# Patient Record
Sex: Female | Born: 1960 | Race: White | Hispanic: No | Marital: Single | Smoking: Current every day smoker
Health system: Southern US, Community
[De-identification: ages and names within clinical notes are randomized; demographics above are authoritative.]

## PROBLEM LIST (undated history)

## (undated) DIAGNOSIS — I639 Cerebral infarction, unspecified: Secondary | ICD-10-CM

## (undated) HISTORY — PX: OTHER SURGICAL HISTORY: SHX169

## (undated) HISTORY — PX: HERNIA REPAIR: SHX51

## (undated) HISTORY — PX: ABDOMINAL HYSTERECTOMY: SHX81

---

## 1997-09-30 ENCOUNTER — Inpatient Hospital Stay (HOSPITAL_COMMUNITY): Admission: AD | Admit: 1997-09-30 | Discharge: 1997-09-30 | Payer: Self-pay | Admitting: Obstetrics and Gynecology

## 1997-11-01 ENCOUNTER — Inpatient Hospital Stay (HOSPITAL_COMMUNITY): Admission: AD | Admit: 1997-11-01 | Discharge: 1997-11-01 | Payer: Self-pay | Admitting: Obstetrics and Gynecology

## 1997-11-30 ENCOUNTER — Inpatient Hospital Stay (HOSPITAL_COMMUNITY): Admission: AD | Admit: 1997-11-30 | Discharge: 1997-11-30 | Payer: Self-pay | Admitting: Obstetrics and Gynecology

## 1997-12-29 ENCOUNTER — Inpatient Hospital Stay (HOSPITAL_COMMUNITY): Admission: AD | Admit: 1997-12-29 | Discharge: 1997-12-29 | Payer: Self-pay | Admitting: Obstetrics and Gynecology

## 1998-02-02 ENCOUNTER — Inpatient Hospital Stay (HOSPITAL_COMMUNITY): Admission: AD | Admit: 1998-02-02 | Discharge: 1998-02-02 | Payer: Self-pay | Admitting: Obstetrics and Gynecology

## 1998-03-03 ENCOUNTER — Inpatient Hospital Stay (HOSPITAL_COMMUNITY): Admission: AD | Admit: 1998-03-03 | Discharge: 1998-03-03 | Payer: Self-pay | Admitting: *Deleted

## 1999-05-25 ENCOUNTER — Other Ambulatory Visit: Admission: RE | Admit: 1999-05-25 | Discharge: 1999-05-25 | Payer: Self-pay | Admitting: Gynecology

## 1999-08-01 ENCOUNTER — Other Ambulatory Visit: Admission: RE | Admit: 1999-08-01 | Discharge: 1999-08-01 | Payer: Self-pay | Admitting: Obstetrics and Gynecology

## 1999-11-27 ENCOUNTER — Ambulatory Visit: Admission: RE | Admit: 1999-11-27 | Discharge: 1999-11-27 | Payer: Self-pay | Admitting: Gynecology

## 1999-12-11 ENCOUNTER — Inpatient Hospital Stay (HOSPITAL_COMMUNITY): Admission: RE | Admit: 1999-12-11 | Discharge: 1999-12-15 | Payer: Self-pay | Admitting: Obstetrics and Gynecology

## 2000-08-12 ENCOUNTER — Encounter: Payer: Self-pay | Admitting: Obstetrics and Gynecology

## 2000-08-12 ENCOUNTER — Ambulatory Visit (HOSPITAL_COMMUNITY): Admission: RE | Admit: 2000-08-12 | Discharge: 2000-08-12 | Payer: Self-pay | Admitting: Obstetrics and Gynecology

## 2000-08-26 ENCOUNTER — Ambulatory Visit: Admission: RE | Admit: 2000-08-26 | Discharge: 2000-08-26 | Payer: Self-pay | Admitting: Gynecologic Oncology

## 2000-08-29 ENCOUNTER — Encounter: Payer: Self-pay | Admitting: Obstetrics and Gynecology

## 2000-09-03 ENCOUNTER — Inpatient Hospital Stay (HOSPITAL_COMMUNITY): Admission: RE | Admit: 2000-09-03 | Discharge: 2000-09-06 | Payer: Self-pay | Admitting: Obstetrics and Gynecology

## 2003-01-26 ENCOUNTER — Other Ambulatory Visit: Admission: RE | Admit: 2003-01-26 | Discharge: 2003-01-26 | Payer: Self-pay | Admitting: Obstetrics and Gynecology

## 2012-04-29 ENCOUNTER — Encounter: Payer: Self-pay | Admitting: Emergency Medicine

## 2012-04-29 ENCOUNTER — Emergency Department (INDEPENDENT_AMBULATORY_CARE_PROVIDER_SITE_OTHER)
Admission: EM | Admit: 2012-04-29 | Discharge: 2012-04-29 | Disposition: A | Discharge status: Home / Self Care | Payer: BC Managed Care – PPO | Source: Home / Self Care | Attending: Family Medicine | Admitting: Family Medicine

## 2012-04-29 DIAGNOSIS — S91009A Unspecified open wound, unspecified ankle, initial encounter: Secondary | ICD-10-CM

## 2012-04-29 DIAGNOSIS — S81011A Laceration without foreign body, right knee, initial encounter: Secondary | ICD-10-CM

## 2012-04-29 HISTORY — DX: Cerebral infarction, unspecified: I63.9

## 2012-04-29 MED ORDER — CEPHALEXIN 500 MG PO CAPS: 500.0000 mg | ORAL_CAPSULE | Freq: Three times a day (TID) | ORAL | Status: DC

## 2012-04-29 NOTE — ED Notes (Signed)
Rt knee laceration, fell last night in a parking lot

## 2012-04-29 NOTE — ED Provider Notes (Signed)
History     CSN: [id number]  Arrival date & time 04/29/12  1402   First MD Initiated Contact with Patient 04/29/12 1507      Chief Complaint  Patient presents with  . Extremity Laceration      HPI Comments: Patient tripped last night wearing high heels, lacerating her right leg beneath knee.  She reports minimal pain, and feels well otherwise.  Her last Tdap was two years ago.  Patient is a 52 y.o. female presenting with skin laceration. The history is provided by the patient.  Laceration Location: right knee. Length (cm):  3 Depth:  Through dermis Quality comment:  Flap Bleeding: controlled with pressure   Time since incident:  1 day Laceration mechanism:  Fall Pain details:    Quality:  Aching   Severity:  Mild   Timing:  Constant   Progression:  Improving Foreign body present:  No foreign bodies Tetanus status:  Up to date   Past Medical History  Diagnosis Date  . Stroke     Past Surgical History  Procedure Laterality Date  . Hernia repair    . Abdominal hysterectomy    . Cesarean section    . Ovarian tumor      Family History  Problem Relation Age of Onset  . Thyroid disease Mother   . Congestive Heart Failure Father     History  Substance Use Topics  . Smoking status: Current Every Day Smoker -- 1.00 packs/day for 35 years    Types: Cigarettes  . Smokeless tobacco: Not on file  . Alcohol Use: Yes    OB History   Grav Para Term Preterm Abortions TAB SAB Ect Mult Living                  Review of Systems  All other systems reviewed and are negative.    Allergies  Review of patient's allergies indicates no known allergies.  Home Medications   Current Outpatient Rx  Name  Route  Sig  Dispense  Refill  . ALPRAZolam (XANAX) 1 MG tablet   Oral   Take 1 mg by mouth at bedtime as needed for sleep.         Marland Kitchen warfarin (COUMADIN) 10 MG tablet   Oral   Take 10 mg by mouth daily.         . cephALEXin (KEFLEX) 500 MG capsule   Oral  Take 1 capsule (500 mg total) by mouth 3 (three) times daily.   36 capsule   0     BP 125/85  Pulse 94  Temp(Src) 98 F (36.7 C) (Oral)  Ht 5\' 1"  (1.549 m)  Wt 187 lb (84.823 kg)  BMI 35.35 kg/m2  SpO2 100%  Physical Exam  Nursing note and vitals reviewed. Constitutional: She is oriented to person, place, and time. She appears well-developed and well-nourished. No distress.  Patient is obese (BMI 35.4)  HENT:  Head: Atraumatic.  Eyes: Conjunctivae are normal. Pupils are equal, round, and reactive to light.  Musculoskeletal: Normal range of motion. She exhibits tenderness.       Right knee: She exhibits laceration. She exhibits normal range of motion, no swelling, no effusion, no ecchymosis, no deformity and no erythema. Tenderness found.       Legs: Below the right patella is a 3cm long simple flap laceration into sub-cutaneous fat.  Knee has full range of motion.  Neurological: She is alert and oriented to person, place, and time.  Skin: Skin  is warm and dry.    ED Course  Procedures   Laceration Repair Discussed benefits and risks of procedure and verbal consent obtained. Using sterile technique and local 2% lidocaine with epinephrine, cleansed wound with Betadine followed by copious lavage with normal saline.  Wound carefully inspected for debris and foreign bodies; none found.  Wound closed with #9, 4-0 interrupted nylon sutures.  Bacitracin and non-stick sterile dressing applied.  Wound precautions explained to patient.  Return for suture removal in 12 days.         1. Laceration of right knee without foreign body, initial encounter       MDM  Will begin prophylactic Keflex.  Change dressing daily and apply Bacitracin ointment to wound.  Keep wound clean and dry.  Return for any signs of infection:  Increasing redness, swelling, pain, heat, drainage, etc.   Return in 12 days for suture removal.         Lattie Haw, MD 04/29/12 [phone]

## 2012-05-11 ENCOUNTER — Encounter: Payer: Self-pay | Admitting: *Deleted

## 2012-05-11 ENCOUNTER — Emergency Department (INDEPENDENT_AMBULATORY_CARE_PROVIDER_SITE_OTHER)
Admission: EM | Admit: 2012-05-11 | Discharge: 2012-05-11 | Disposition: A | Discharge status: Home / Self Care | Payer: BC Managed Care – PPO | Source: Home / Self Care | Attending: Family Medicine | Admitting: Family Medicine

## 2012-05-11 DIAGNOSIS — Z4802 Encounter for removal of sutures: Secondary | ICD-10-CM

## 2012-05-11 NOTE — ED Notes (Signed)
The pt is here for suture removal from her RT knee laceration on 04/29/12.

## 2012-05-11 NOTE — ED Provider Notes (Signed)
History     CSN: [id number]  Arrival date & time 05/11/12  1643   First MD Initiated Contact with Patient 05/11/12 1725      Chief Complaint  Patient presents with  . Suture / Staple Removal       HPI Comments: The patient is here for suture removal from her RT knee laceration on 04/29/12.  Patient is a 52 y.o. female presenting with suture removal. The history is provided by the patient.  Suture / Staple Removal  The sutures were placed 11 to 14 days ago. Treatments since wound repair include regular soap and water washings. There has been no drainage from the wound. There is no redness present. There is no swelling present. The pain has no pain. She has no difficulty moving the affected extremity or digit.    Past Medical History  Diagnosis Date  . Stroke     Past Surgical History  Procedure Laterality Date  . Hernia repair    . Abdominal hysterectomy    . Cesarean section    . Ovarian tumor      Family History  Problem Relation Age of Onset  . Thyroid disease Mother   . Congestive Heart Failure Father     History  Substance Use Topics  . Smoking status: Current Every Day Smoker -- 1.00 packs/day for 35 years    Types: Cigarettes  . Smokeless tobacco: Not on file  . Alcohol Use: Yes    OB History   Grav Para Term Preterm Abortions TAB SAB Ect Mult Living                  Review of Systems  All other systems reviewed and are negative.    Allergies  Review of patient's allergies indicates no known allergies.  Home Medications   Current Outpatient Rx  Name  Route  Sig  Dispense  Refill  . ALPRAZolam (XANAX) 1 MG tablet   Oral   Take 1 mg by mouth at bedtime as needed for sleep.         . cephALEXin (KEFLEX) 500 MG capsule   Oral   Take 1 capsule (500 mg total) by mouth 3 (three) times daily.   36 capsule   0   . warfarin (COUMADIN) 10 MG tablet   Oral   Take 10 mg by mouth daily.           BP 144/92  Pulse 85  Temp(Src) 97.9 F  (36.6 C) (Oral)  Resp 16  SpO2 98%  Physical Exam Nursing notes and Vital Signs reviewed. Appearance:  Patient appears healthy, stated age, and in no acute distress Eyes:  Pupils are equal, round, and reactive to light and accomodation.  Extraocular movement is intact.  Conjunctivae are not inflamed  Skin:  No rash present.   Right Knee:  Well-healed laceration inferior to the knee.  No swelling, drainage, or tenderness ED Course  Procedures  None      1. Visit for suture removal       MDM  Sutures removed, and wound reinforced with butterfly bandages. Leave butterfly bandages in place as long as possible.        Lattie Haw, MD 05/14/12 1739

## 2016-04-26 ENCOUNTER — Emergency Department (INDEPENDENT_AMBULATORY_CARE_PROVIDER_SITE_OTHER)
Admission: EM | Admit: 2016-04-26 | Discharge: 2016-04-26 | Disposition: A | Discharge status: Home / Self Care | Payer: BLUE CROSS/BLUE SHIELD | Source: Home / Self Care | Attending: Family Medicine | Admitting: Family Medicine

## 2016-04-26 ENCOUNTER — Encounter: Payer: Self-pay | Admitting: *Deleted

## 2016-04-26 DIAGNOSIS — S83402A Sprain of unspecified collateral ligament of left knee, initial encounter: Secondary | ICD-10-CM | POA: Diagnosis not present

## 2016-04-26 NOTE — ED Provider Notes (Signed)
[facility]    CSN: [id number] Arrival date & time: 04/26/16  1109     History   Chief Complaint Chief Complaint  Patient presents with  . Knee Pain    HPI Patricia Hensley is a 56 y.o. female.   At about noon yesterday, patient tripped over a gas pump hose while refueling her car, landing on both anterior knees.  She has had significant pain/swelling in her left knee, and only mild pain in the right knee.  She has minimal pain when not walking/standing.   The history is provided by the patient.  Knee Pain  Location:  Knee Time since incident:  1 day Injury: yes   Mechanism of injury: fall   Fall:    Impact surface:  Concrete   Point of impact:  Knees   Entrapped after fall: no   Knee location:  L knee Pain details:    Quality:  Aching   Radiates to: left thigh.   Severity:  Moderate   Onset quality:  Sudden   Duration:  1 day   Timing:  Constant   Progression:  Improving Chronicity:  New Dislocation: no   Prior injury to area:  No Relieved by:  Ice Worsened by:  Bearing weight and flexion Associated symptoms: decreased ROM, stiffness and swelling   Associated symptoms: no back pain, no muscle weakness, no numbness and no tingling   Risk factors: obesity     Past Medical History:  Diagnosis Date  . Stroke [facility])     There are no active problems to display for this patient.   Past Surgical History:  Procedure Laterality Date  . ABDOMINAL HYSTERECTOMY    . CESAREAN SECTION    . HERNIA REPAIR    . Ovarian tumor      OB History    No data available       Home Medications    Prior to Admission medications   Medication Sig Start Date End Date Taking? Authorizing Provider  ALPRAZolam Prudy Feeler) 1 MG tablet Take 1 mg by mouth at bedtime as needed for sleep.   Yes Historical Provider, MD  warfarin (COUMADIN) 10 MG tablet Take 10 mg by mouth daily.   Yes Historical Provider, MD    Family History Family History  Problem Relation Age of  Onset  . Thyroid disease Mother   . Congestive Heart Failure Father     Social History Social History  Substance Use Topics  . Smoking status: Current Every Day Smoker    Packs/day: 1.00    Years: 35.00    Types: Cigarettes  . Smokeless tobacco: Never Used  . Alcohol use Yes     Allergies   Patient has no known allergies.   Review of Systems Review of Systems  Musculoskeletal: Positive for stiffness. Negative for back pain.  All other systems reviewed and are negative.    Physical Exam Triage Vital Signs ED Triage Vitals  Enc Vitals Group     BP 04/26/16 1129 (!) 144/90     Pulse Rate 04/26/16 1129 81     Resp --      Temp --      Temp src --      SpO2 04/26/16 1129 96 %     Weight 04/26/16 1130 228 lb (103.4 kg)     Height --      Head Circumference --      Peak Flow --      Pain Score 04/26/16 1130  5     Pain Loc --      Pain Edu? --      Excl. in GC? --    No data found.   Updated Vital Signs BP (!) 144/90 (BP Location: Left Arm)   Pulse 81   Wt 228 lb (103.4 kg)   SpO2 96%   BMI 43.08 kg/m   Visual Acuity Right Eye Distance:   Left Eye Distance:   Bilateral Distance:    Right Eye Near:   Left Eye Near:    Bilateral Near:     Physical Exam  Constitutional: She appears well-developed and well-nourished. No distress.  HENT:  Head: Atraumatic.  Eyes: Pupils are equal, round, and reactive to light.  Neck: Normal range of motion.  Cardiovascular: Normal rate.   Pulmonary/Chest: Effort normal.  Musculoskeletal:       Left knee: She exhibits decreased range of motion and bony tenderness. She exhibits no swelling, no effusion, no ecchymosis, no laceration, no erythema, no LCL laxity, normal meniscus and no MCL laxity. Tenderness found. Lateral joint line and LCL tenderness noted. No patellar tendon tenderness noted.       Legs:  Left knee:  Patient has difficulty flexing beyond 90 degrees.  No effusion, erythema, or warmth.  Knee stable,  negative drawer test.  McMurray test negative.  Tenderness over LCL. There is a minimal abrasion anteriorly without evidence cellulitis.  Right knee:  Full active range of motion.  No effusion, erythema, or warmth.  Knee stable, negative drawer test.  McMurray test negative.  Minimal tenderness to palpation.  Neurological: She is alert.  Skin: Skin is warm and dry.  Nursing note and vitals reviewed.    UC Treatments / Results  Labs (all labs ordered are listed, but only abnormal results are displayed) Labs Reviewed - No data to display  EKG  EKG Interpretation None       Radiology No results found.  Procedures Procedures (including critical care time)  Medications Ordered in UC Medications - No data to display   Initial Impression / Assessment and Plan / UC Course  I have reviewed the triage vital signs and the nursing notes.  Pertinent labs & imaging results that were available during my care of the patient were reviewed by me and considered in my medical decision making (see chart for details).    Dispensed hinged knee brace. Applied Bacitracin/bandage to mild abrasion left knee. Apply ice pack for 30 minutes every 1 to 2 hours today and tomorrow.  Elevate.  Wear brace for about 2 to 3 weeks.  Begin range of motion and stretching exercises as per instruction sheet.  May take Tylenol as needed for pain. Followup with Dr. Rodney Langton or Dr. Clementeen [facility] ([facility]) if not improving about two weeks.     Final Clinical Impressions(s) / UC Diagnoses   Final diagnoses:  Sprain of collateral ligament of left knee, initial encounter    New Prescriptions New Prescriptions   No medications on file     Lattie Haw, MD 04/26/16 1214

## 2016-04-26 NOTE — ED Triage Notes (Signed)
Patient fell yesterday while trying to step over the gas hose while fueling her car. She fell onto both knees. No previous injuries.

## 2016-04-26 NOTE — Discharge Instructions (Signed)
Apply ice pack for 30 minutes every 1 to 2 hours today and tomorrow.  Elevate.  Wear brace for about 2 to 3 weeks.  Begin range of motion and stretching exercises as per instruction sheet.  May take Tylenol as needed for pain.

## 2016-05-03 ENCOUNTER — Emergency Department (INDEPENDENT_AMBULATORY_CARE_PROVIDER_SITE_OTHER)
Admission: EM | Admit: 2016-05-03 | Discharge: 2016-05-03 | Disposition: A | Discharge status: Home / Self Care | Payer: BLUE CROSS/BLUE SHIELD | Source: Home / Self Care | Attending: Emergency Medicine | Admitting: Emergency Medicine

## 2016-05-03 ENCOUNTER — Emergency Department (INDEPENDENT_AMBULATORY_CARE_PROVIDER_SITE_OTHER): Payer: BLUE CROSS/BLUE SHIELD

## 2016-05-03 DIAGNOSIS — M25462 Effusion, left knee: Secondary | ICD-10-CM

## 2016-05-03 DIAGNOSIS — M25461 Effusion, right knee: Secondary | ICD-10-CM | POA: Diagnosis not present

## 2016-05-03 DIAGNOSIS — S8001XD Contusion of right knee, subsequent encounter: Secondary | ICD-10-CM

## 2016-05-03 DIAGNOSIS — S8002XD Contusion of left knee, subsequent encounter: Secondary | ICD-10-CM

## 2016-05-03 NOTE — ED Triage Notes (Signed)
Patient was seen 04/26/16 after falling on both knees. X-ray was not available that day. She would like her knees x-ray.Pain is improved but still present. Swelling has improved. Knee brace does not fit anymore.

## 2016-05-03 NOTE — ED Provider Notes (Signed)
[facility]    CSN: [id number] Arrival date & time: 05/03/16  1218     History   Chief Complaint Chief Complaint  Patient presents with  . Knee Injury    HPI Patricia Hensley is a 56 y.o. female.   The history is provided by the patient.  Knee Pain  Location:  Knee Time since incident:  8 days Injury: yes   Mechanism of injury: fall   Fall:    Fall occurred: At gas station, accidentally fell on to concrete median.   Impact surface:  Concrete   Point of impact:  Knees Knee location:  L knee and R knee Pain details:    Quality:  Sharp   Radiates to:  Does not radiate   Severity:  Moderate   Onset quality:  Sudden   Subjective pain progression: gradually improving,but still has more Left knee (than R knee ) pain. Chronicity:  New Relieved by:  Acetaminophen Worsened by:  Bearing weight, activity, extension and flexion Associated symptoms: decreased ROM and stiffness   Associated symptoms: no back pain, no fever, no neck pain and no numbness   Risk factors: no known bone disorder    Pain 4/10.  On coumadin chronically for decades (prior mild cva in her 13's) Past Medical History:  Diagnosis Date  . Stroke [facility])     There are no active problems to display for this patient.   Past Surgical History:  Procedure Laterality Date  . ABDOMINAL HYSTERECTOMY    . CESAREAN SECTION    . HERNIA REPAIR    . Ovarian tumor      OB History    No data available       Home Medications    Prior to Admission medications   Medication Sig Start Date End Date Taking? Authorizing Provider  ALPRAZolam Prudy Feeler) 1 MG tablet Take 1 mg by mouth at bedtime as needed for sleep.    Historical Provider, MD  warfarin (COUMADIN) 10 MG tablet Take 10 mg by mouth daily.    Historical Provider, MD    Family History Family History  Problem Relation Age of Onset  . Thyroid disease Mother   . Congestive Heart Failure Father     Social History Social History  Substance  Use Topics  . Smoking status: Current Every Day Smoker    Packs/day: 1.00    Years: 35.00    Types: Cigarettes  . Smokeless tobacco: Never Used  . Alcohol use Yes     Allergies   Patient has no known allergies.   Review of Systems Review of Systems  Constitutional: Negative for fever.  Musculoskeletal: Positive for stiffness. Negative for back pain and neck pain.  All other systems reviewed and are negative.  See also hpi  Physical Exam Triage Vital Signs ED Triage Vitals  Enc Vitals Group     BP 05/03/16 1232 (!) 146/88     Pulse Rate 05/03/16 1232 77     Resp --      Temp --      Temp src --      SpO2 05/03/16 1232 97 %     Weight --      Height --      Head Circumference --      Peak Flow --      Pain Score 05/03/16 1233 4     Pain Loc --      Pain Edu? --      Excl. in GC? --  No data found.   Updated Vital Signs BP (!) 146/88 (BP Location: Left Arm)   Pulse 77   SpO2 97%   Visual Acuity Right Eye Distance:   Left Eye Distance:   Bilateral Distance:    Right Eye Near:   Left Eye Near:    Bilateral Near:     Physical Exam  Constitutional: She is oriented to person, place, and time. She appears well-developed and well-nourished. No distress.  Uncomfortable from bilat knee pain. Walks very slowly  HENT:  Head: Normocephalic and atraumatic.  Eyes: Pupils are equal, round, and reactive to light. No scleral icterus.  Neck: Normal range of motion. Neck supple.  Cardiovascular: Normal rate and regular rhythm.   Pulmonary/Chest: Effort normal.  Abdominal: She exhibits no distension.  Musculoskeletal:       Right knee: She exhibits decreased range of motion, swelling and ecchymosis. She exhibits no laceration. Tenderness found. Medial joint line and patellar tendon tenderness noted.       Left knee: She exhibits decreased range of motion, swelling and ecchymosis. Tenderness found. Medial joint line, lateral joint line and patellar tendon tenderness  noted.       Right lower leg: She exhibits edema (mild. No cords or calf tenderness. Homans Neg bilat.). She exhibits no tenderness.       Left lower leg: She exhibits no tenderness and no swelling.  Neurological: She is alert and oriented to person, place, and time. No cranial nerve deficit.  Skin: Skin is warm and dry.  Psychiatric: She has a normal mood and affect. Her behavior is normal.  Vitals reviewed.    UC Treatments / Results  Labs (all labs ordered are listed, but only abnormal results are displayed) Labs Reviewed - No data to display  EKG  EKG Interpretation None       Radiology Dg Knee Complete 4 Views Left  Result Date: 05/03/2016 CLINICAL DATA:  Fall 04/25/2016 with bilateral knee pain. Initial encounter EXAM: LEFT KNEE - COMPLETE 4+ VIEW COMPARISON:  Right knee radiographs from the same day. FINDINGS: A moderate-sized joint effusion is present. The knee is located. No acute fracture is present. IMPRESSION: 1. Moderate-sized joint effusion without acute or focal osseous abnormality. Ligamentous injury is not excluded. This may be related to trauma. Electronically Signed   By: Marin Roberts M.D.   On: 05/03/2016 13:21   Dg Knee Complete 4 Views Right  Result Date: 05/03/2016 CLINICAL DATA:  Bilateral knee pain. Fall 04/25/2016. Initial encounter. EXAM: RIGHT KNEE - COMPLETE 4+ VIEW COMPARISON:  Left knee radiographs from the same day. FINDINGS: The right knee is located. No acute osseous abnormalities present. A small joint effusion is evident. IMPRESSION: Small joint effusion without evidence for acute osseous abnormality. Electronically Signed   By: Marin Roberts M.D.   On: 05/03/2016 13:23    Procedures Procedures (including critical care time)  Medications Ordered in UC Medications - No data to display   Initial Impression / Assessment and Plan / UC Course  I have reviewed the triage vital signs and the nursing notes.  Pertinent labs & imaging  results that were available during my care of the patient were reviewed by me and considered in my medical decision making (see chart for details).     Final Clinical Impressions(s) / UC Diagnoses   Final diagnoses:  Contusion of right knee, subsequent encounter  Contusion of left knee, subsequent encounter   Treatment options discussed, as well as risks, benefits, alternatives. Patient voiced  understanding and agreement with the following plans: L knee brace applied (donjoy, unhinged brace provided best relief) Tylenol She declined rx pain med. Other advice given. Follow-up with sports med or ortho in 5-7 days. Precautions discussed. Red flags discussed. Questions invited and answered. Patient voiced understanding and agreement.    Lajean Manes, MD 05/03/16 1359

## 2018-04-03 IMAGING — DX DG KNEE COMPLETE 4+V*L*
4 series · 4 of 4 positions shown · non-contrast
Comparison: Right knee radiographs from the same day.

CLINICAL DATA: Fall 04/25/2016 with bilateral knee pain. Initial
encounter

EXAM:
LEFT KNEE - COMPLETE 4+ VIEW

[knee ap]
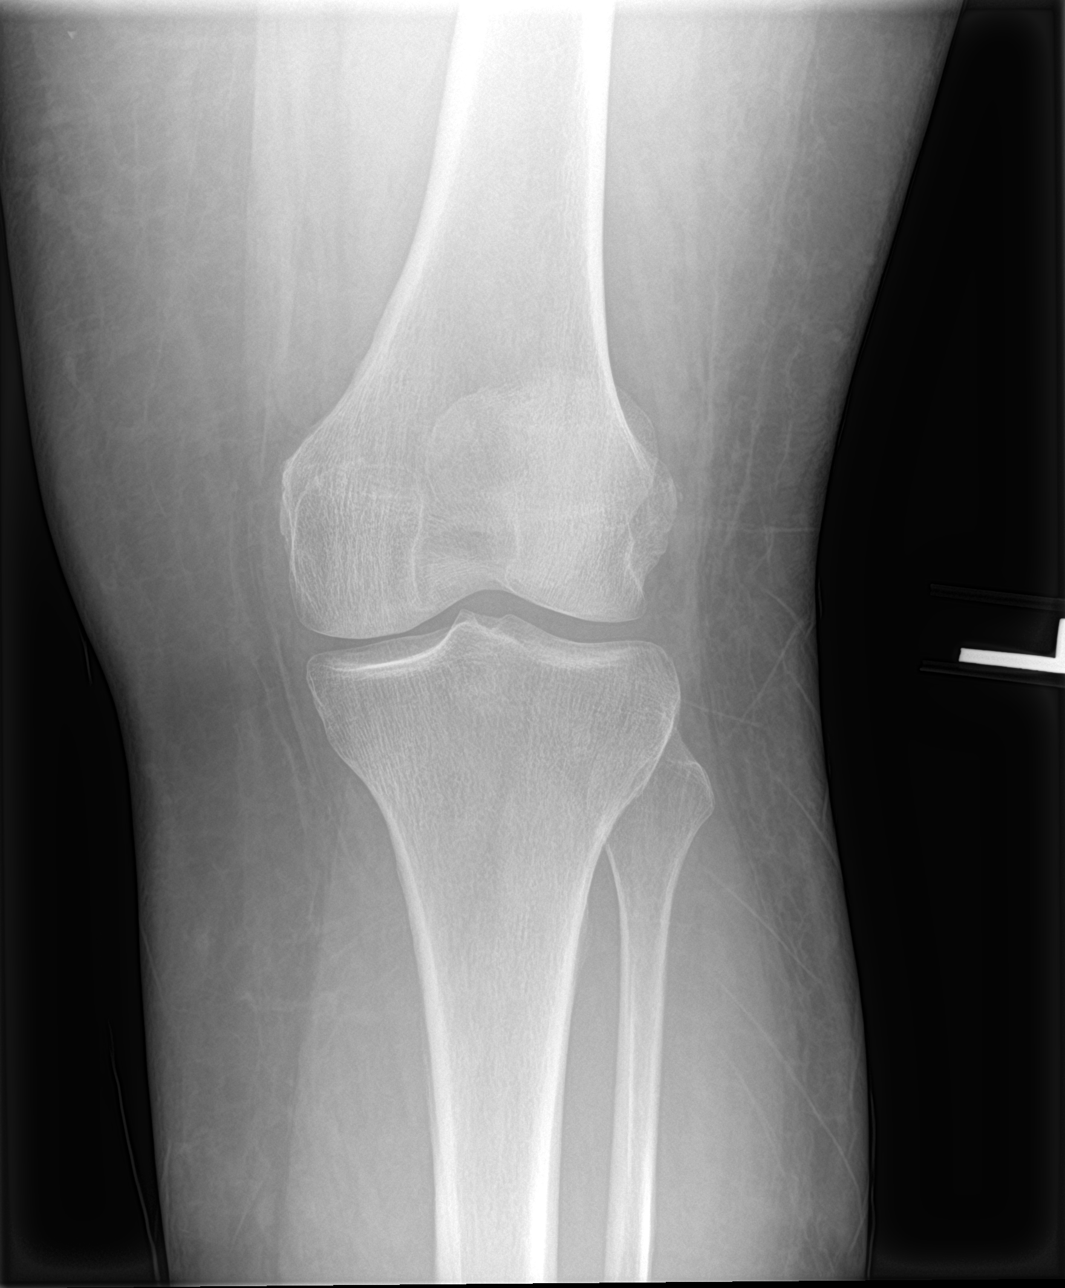

[knee lat]
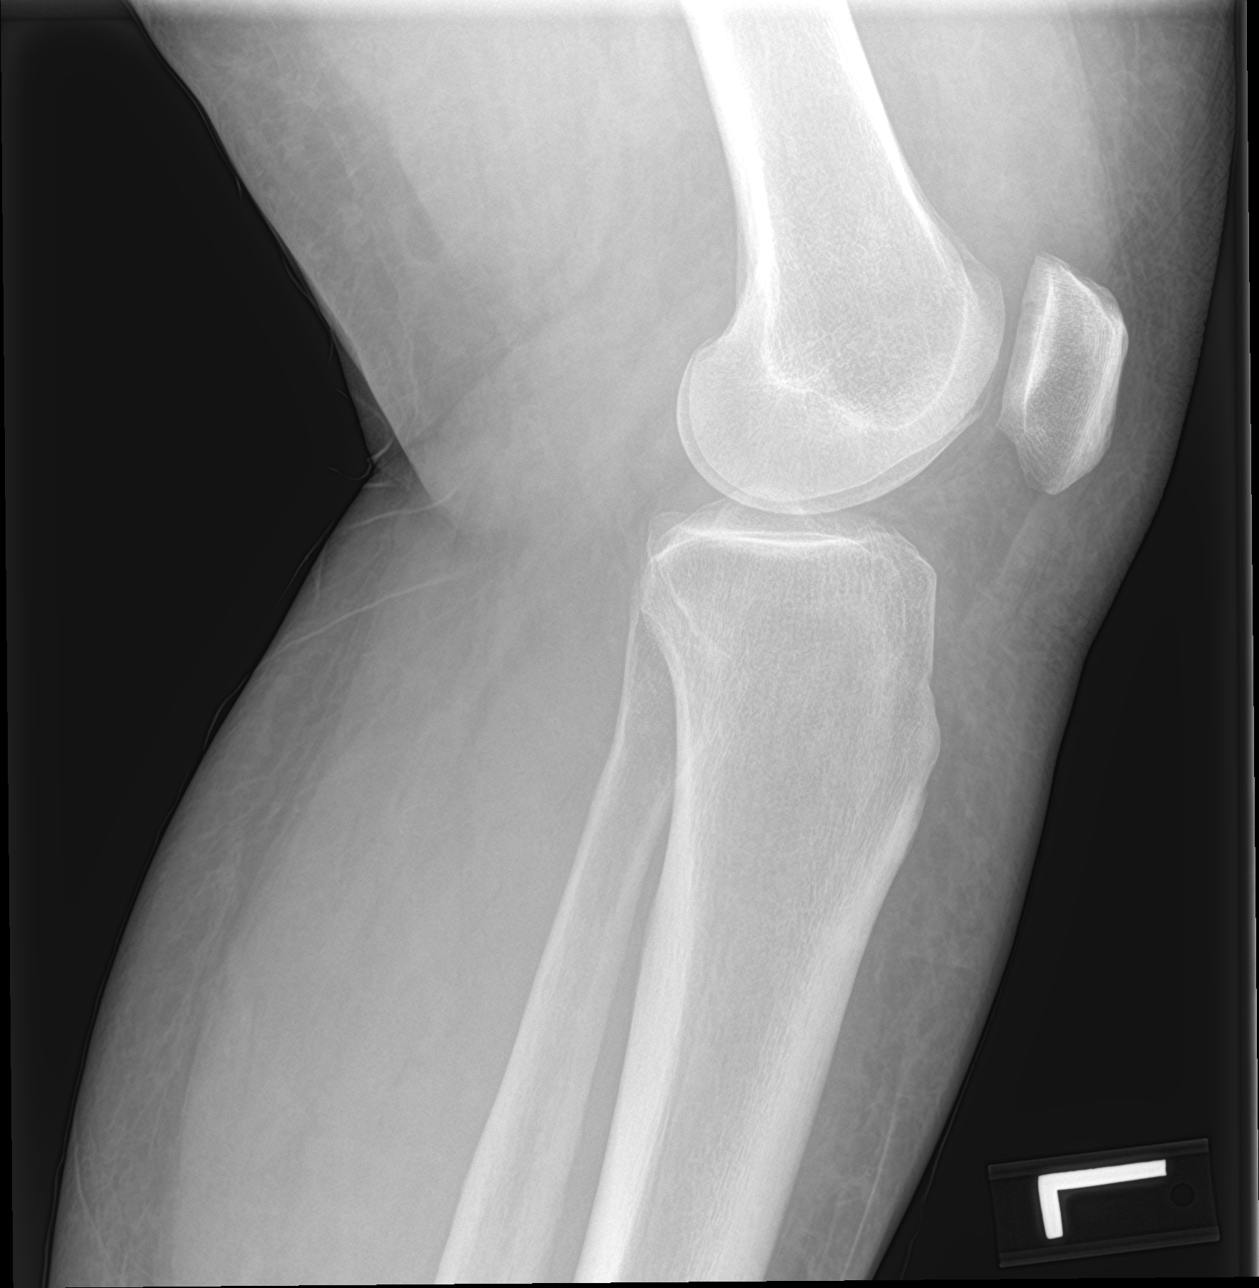

[knee obl (1 of 2)]
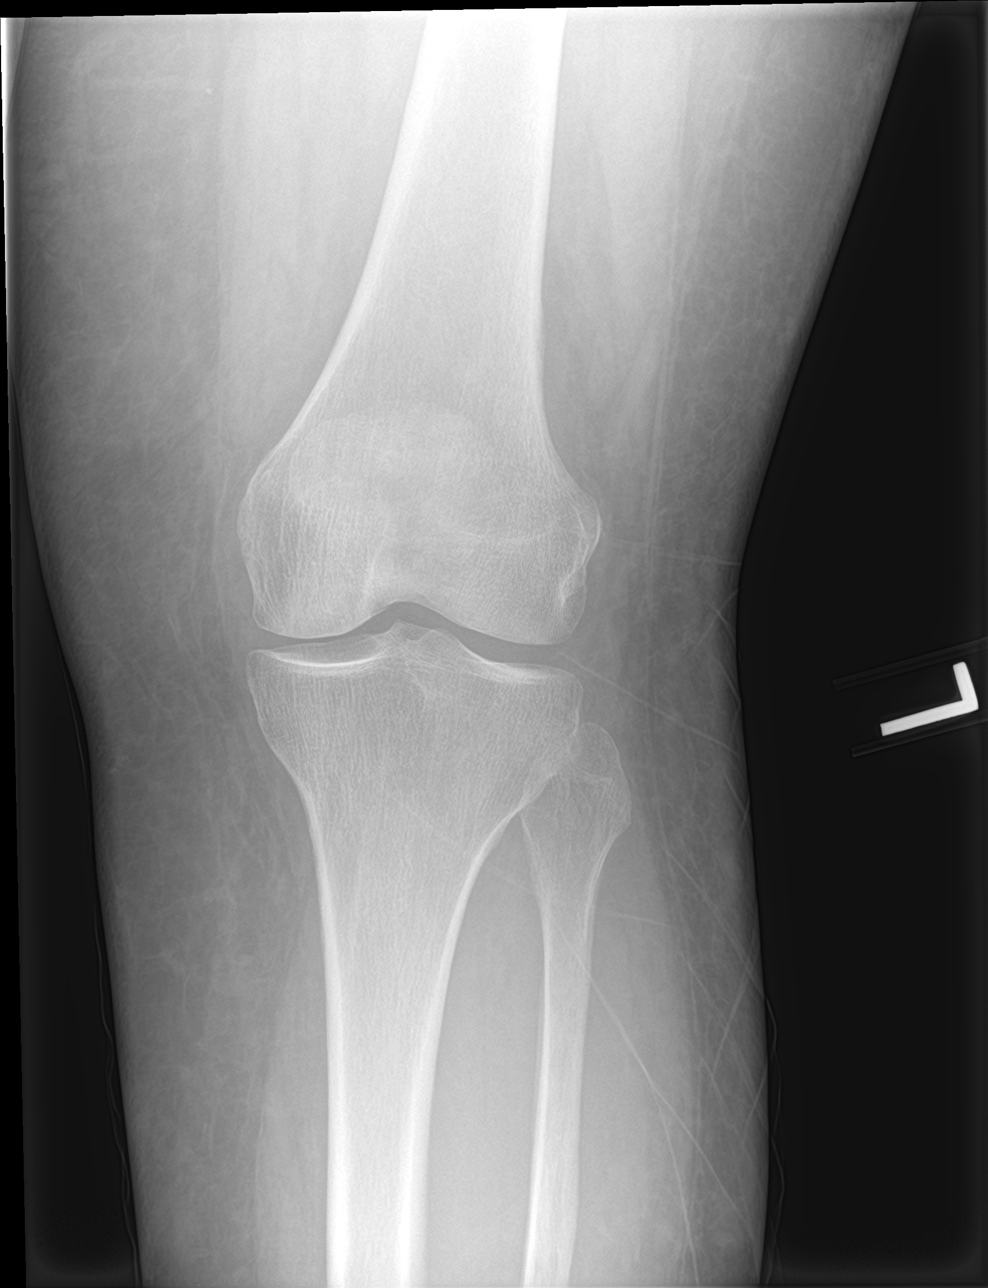

[knee obl (2 of 2)]
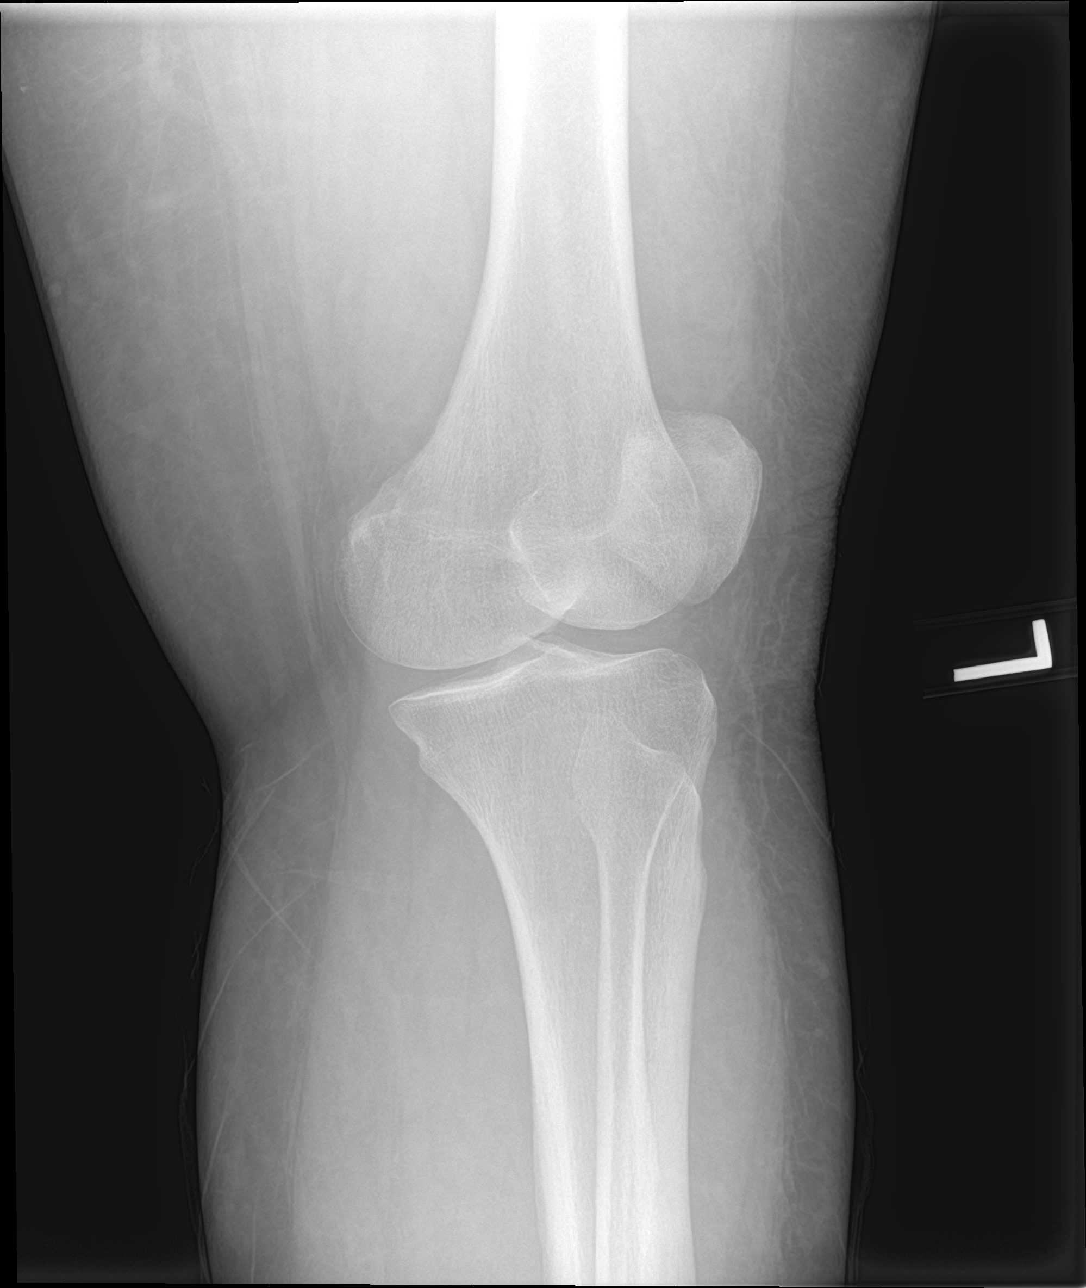

[4 of 4 positions shown; findings below may reference images not displayed]

FINDINGS: A moderate-sized joint effusion is present. The knee is located. No
acute fracture is present.
IMPRESSION: 1. Moderate-sized joint effusion without acute or focal osseous
abnormality. Ligamentous injury is not excluded. This may be related
to trauma.

## 2018-04-03 IMAGING — DX DG KNEE COMPLETE 4+V*R*
4 series · 4 of 4 positions shown · non-contrast
Comparison: Left knee radiographs from the same day.

CLINICAL DATA: Bilateral knee pain. Fall 04/25/2016. Initial
encounter.

EXAM:
RIGHT KNEE - COMPLETE 4+ VIEW

[knee ap]
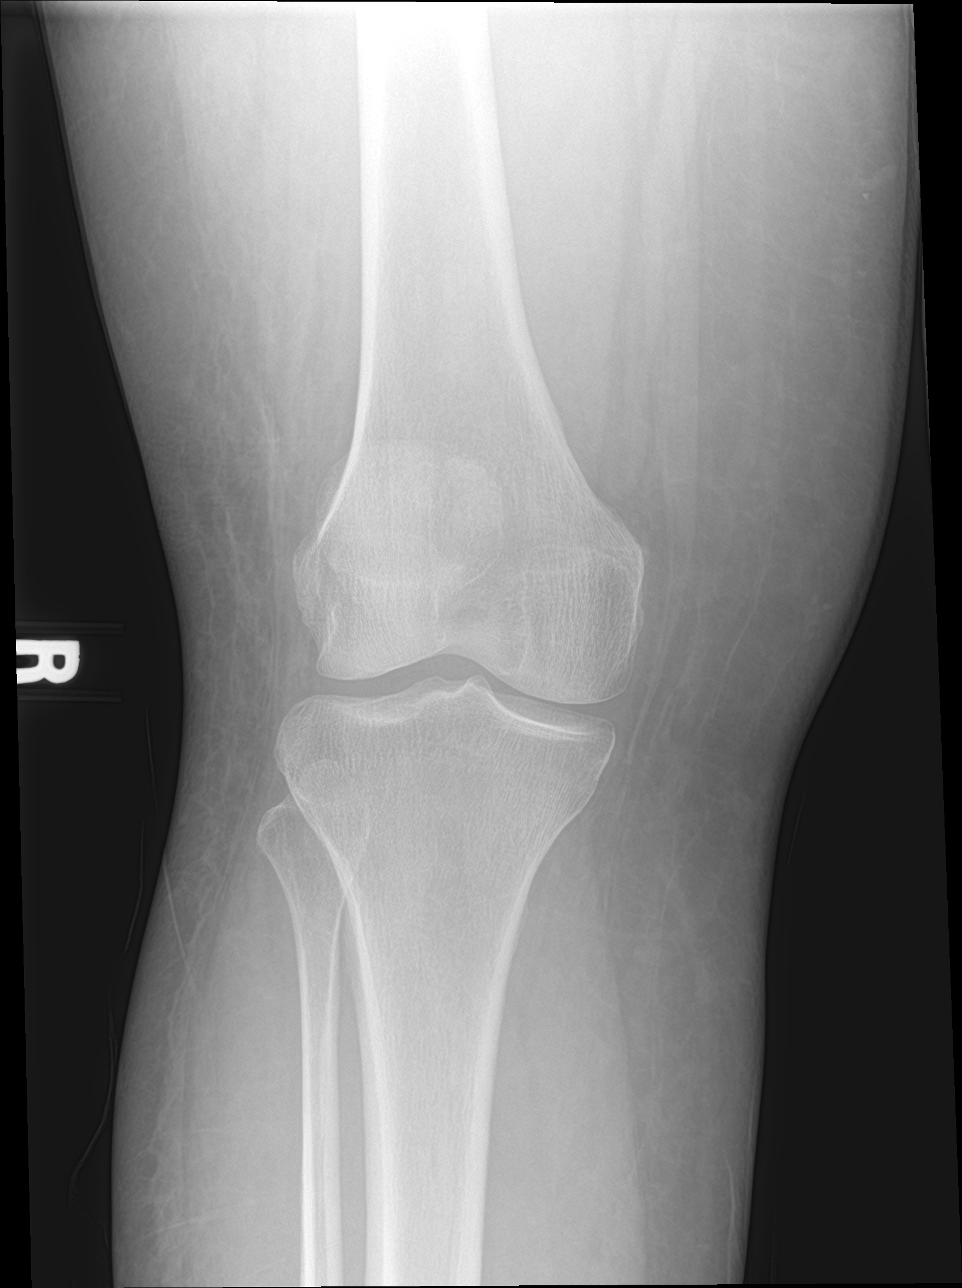

[knee lat]
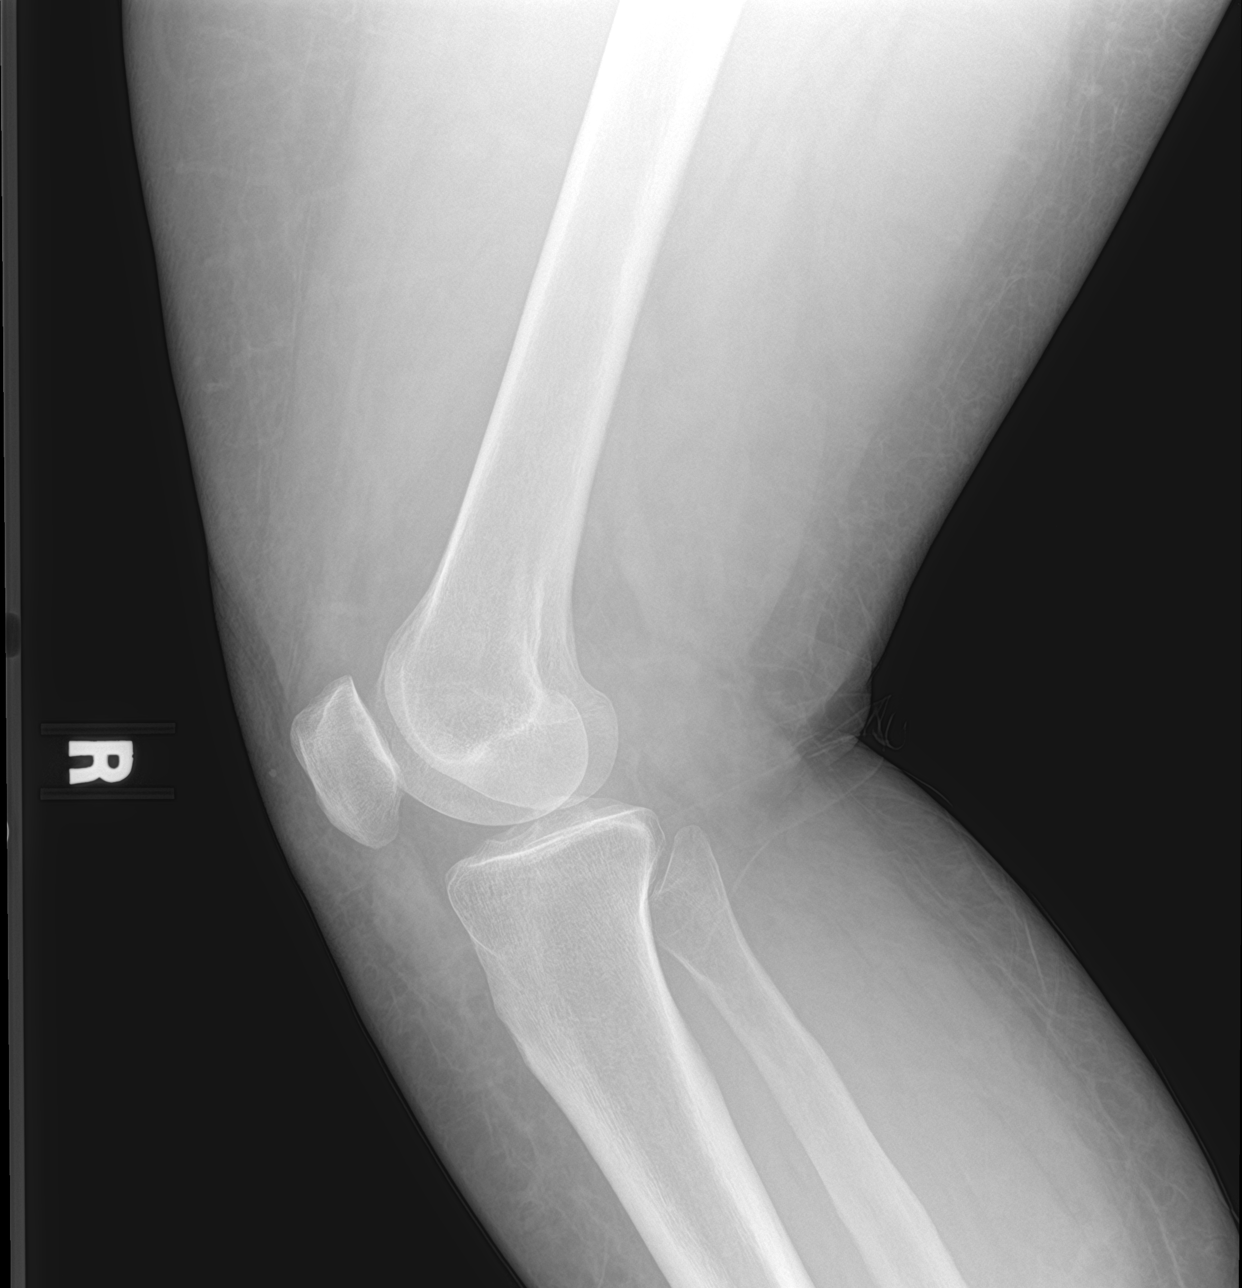

[knee obl (1 of 2)]
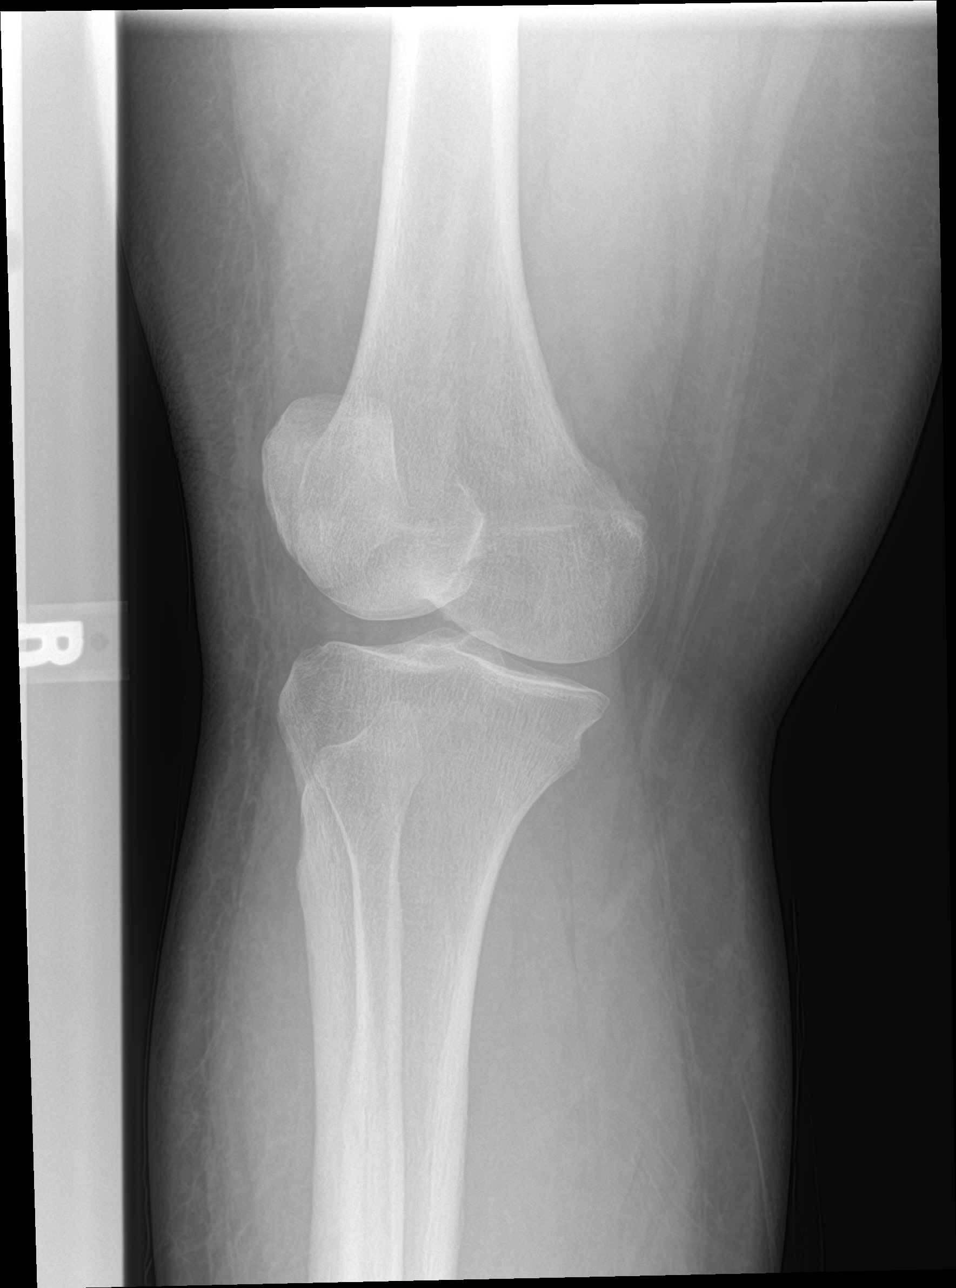

[knee obl (2 of 2)]
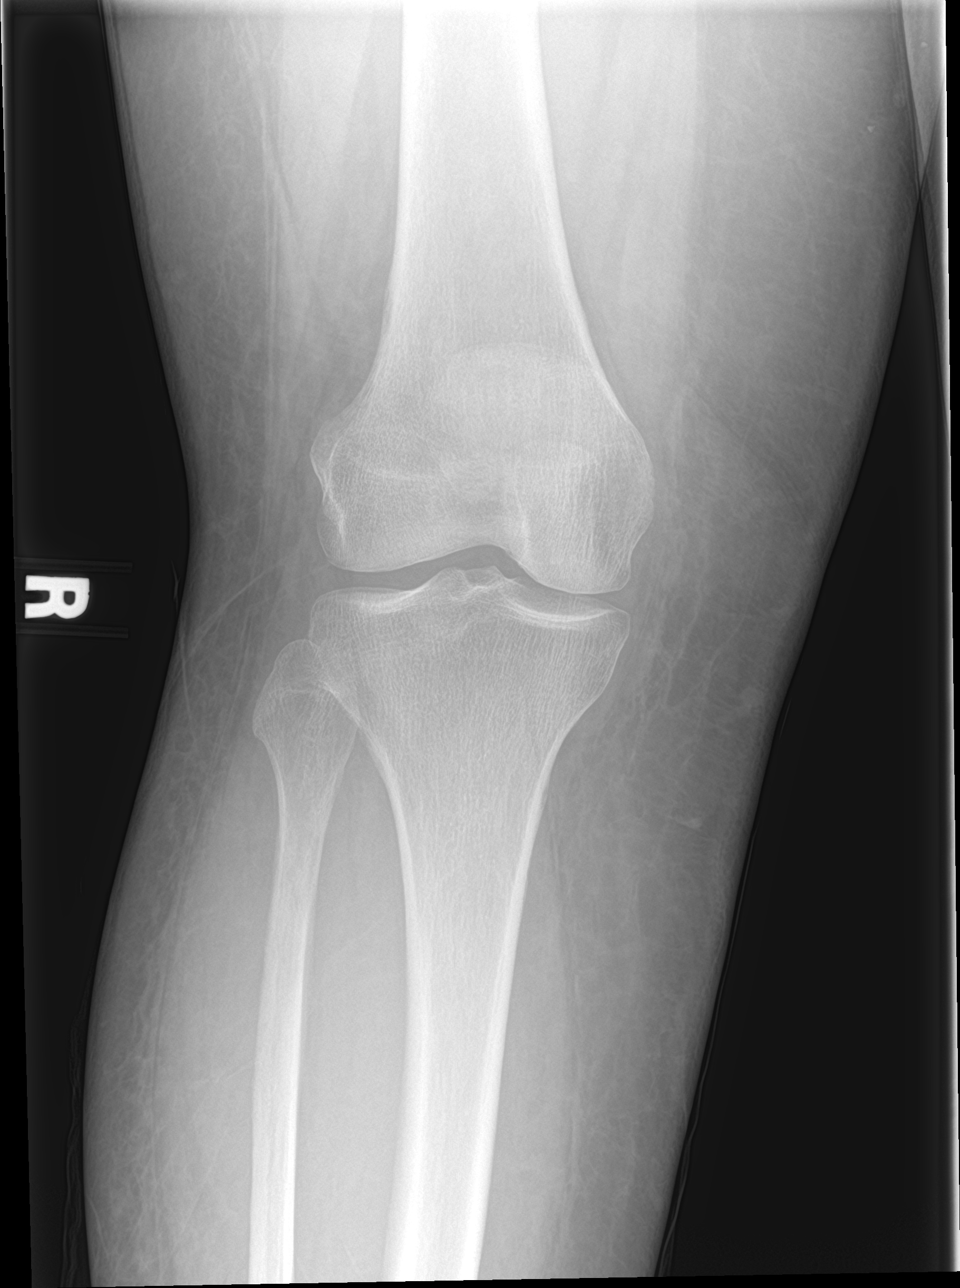

[4 of 4 positions shown; findings below may reference images not displayed]

FINDINGS: The right knee is located. No acute osseous abnormalities present. A
small joint effusion is evident.
IMPRESSION: Small joint effusion without evidence for acute osseous abnormality.
